# Patient Record
Sex: Male | Born: 1955 | Race: White | Hispanic: No | Marital: Married | Smoking: Never smoker
Health system: Southern US, Community
[De-identification: ages and names within clinical notes are randomized; demographics above are authoritative.]

## PROBLEM LIST (undated history)

## (undated) DIAGNOSIS — N200 Calculus of kidney: Secondary | ICD-10-CM

## (undated) DIAGNOSIS — E7800 Pure hypercholesterolemia, unspecified: Secondary | ICD-10-CM

## (undated) HISTORY — PX: KIDNEY STONE SURGERY: SHX686

---

## 2018-03-01 ENCOUNTER — Other Ambulatory Visit: Payer: Self-pay

## 2018-03-01 ENCOUNTER — Encounter: Payer: Self-pay | Admitting: Emergency Medicine

## 2018-03-01 ENCOUNTER — Emergency Department: Payer: Commercial Managed Care - PPO

## 2018-03-01 ENCOUNTER — Other Ambulatory Visit: Payer: Self-pay | Admitting: Radiology

## 2018-03-01 ENCOUNTER — Emergency Department
Admission: EM | Admit: 2018-03-01 | Discharge: 2018-03-01 | Disposition: A | Discharge status: Home / Self Care | Payer: Commercial Managed Care - PPO | Attending: Emergency Medicine | Admitting: Emergency Medicine

## 2018-03-01 DIAGNOSIS — N200 Calculus of kidney: Secondary | ICD-10-CM

## 2018-03-01 DIAGNOSIS — R109 Unspecified abdominal pain: Secondary | ICD-10-CM | POA: Diagnosis present

## 2018-03-01 HISTORY — DX: Calculus of kidney: N20.0

## 2018-03-01 HISTORY — DX: Pure hypercholesterolemia, unspecified: E78.00

## 2018-03-01 LAB — COMPREHENSIVE METABOLIC PANEL
ALBUMIN: 4.7 g/dL (ref 3.5–5.0)
ALT: 31 U/L (ref 17–63)
AST: 37 U/L (ref 15–41)
Alkaline Phosphatase: 55 U/L (ref 38–126)
Anion gap: 5 (ref 5–15)
BUN: 17 mg/dL (ref 6–20)
CHLORIDE: 107 mmol/L (ref 101–111)
CO2: 28 mmol/L (ref 22–32)
Calcium: 9.3 mg/dL (ref 8.9–10.3)
Creatinine, Ser: 1.17 mg/dL (ref 0.61–1.24)
GFR calc Af Amer: >60 mL/min (ref >60)
Glucose, Bld: 134 mg/dL — ABNORMAL HIGH (ref 65–99)
POTASSIUM: 4.2 mmol/L (ref 3.5–5.1)
Sodium: 140 mmol/L (ref 135–145)
Total Bilirubin: 0.7 mg/dL (ref 0.3–1.2)
Total Protein: 7 g/dL (ref 6.5–8.1)

## 2018-03-01 LAB — CBC
HEMATOCRIT: 46.7 % (ref 40.0–52.0)
HEMOGLOBIN: 15.6 g/dL (ref 13.0–18.0)
MCH: 32.6 pg (ref 26.0–34.0)
MCHC: 33.3 g/dL (ref 32.0–36.0)
MCV: 97.8 fL (ref 80.0–100.0)
Platelets: 243 10*3/uL (ref 150–440)
RBC: 4.77 MIL/uL (ref 4.40–5.90)
RDW: 12.4 % (ref 11.5–14.5)
WBC: 17.1 10*3/uL — ABNORMAL HIGH (ref 3.8–10.6)

## 2018-03-01 LAB — URINALYSIS, COMPLETE (UACMP) WITH MICROSCOPIC
Bacteria, UA: NONE SEEN
Bilirubin Urine: NEGATIVE
Glucose, UA: NEGATIVE mg/dL
Hgb urine dipstick: NEGATIVE
KETONES UR: NEGATIVE mg/dL
LEUKOCYTES UA: NEGATIVE
Nitrite: NEGATIVE
Protein, ur: NEGATIVE mg/dL
SQUAMOUS EPITHELIAL / LPF: NONE SEEN (ref 0–5)
Specific Gravity, Urine: 1.018 (ref 1.005–1.030)
pH: 8 (ref 5.0–8.0)

## 2018-03-01 LAB — LIPASE, BLOOD: LIPASE: 32 U/L (ref 11–51)

## 2018-03-01 MED ORDER — HYDROMORPHONE HCL 1 MG/ML IJ SOLN
0.5000 mg | Freq: Once | INTRAMUSCULAR | Status: AC
  Administered 2018-03-01: 0.5 mg via INTRAVENOUS
  Filled 2018-03-01: qty 1

## 2018-03-01 MED ORDER — ONDANSETRON 4 MG PO TBDP: 4.0000 mg | ORAL_TABLET | Freq: Three times a day (TID) | ORAL | 0 refills | Status: AC | PRN

## 2018-03-01 MED ORDER — OXYCODONE-ACETAMINOPHEN 5-325 MG PO TABS
1.0000 | ORAL_TABLET | ORAL | 0 refills | Status: AC | PRN
Start: 2018-03-01 — End: ?

## 2018-03-01 MED ORDER — HYDROMORPHONE HCL 1 MG/ML IJ SOLN
1.0000 mg | Freq: Once | INTRAMUSCULAR | Status: AC
  Administered 2018-03-01: 1 mg via INTRAVENOUS
  Filled 2018-03-01: qty 1

## 2018-03-01 MED ORDER — TAMSULOSIN HCL 0.4 MG PO CAPS: 0.4000 mg | ORAL_CAPSULE | Freq: Every day | ORAL | 0 refills | Status: AC

## 2018-03-01 MED ORDER — SODIUM CHLORIDE 0.9 % IV BOLUS
1000.0000 mL | Freq: Once | INTRAVENOUS | Status: AC
  Administered 2018-03-01: 1000 mL via INTRAVENOUS

## 2018-03-01 MED ORDER — ONDANSETRON HCL 4 MG/2ML IJ SOLN
4.0000 mg | Freq: Once | INTRAMUSCULAR | Status: AC
  Administered 2018-03-01: 4 mg via INTRAVENOUS
  Filled 2018-03-01: qty 2

## 2018-03-01 MED ORDER — KETOROLAC TROMETHAMINE 30 MG/ML IJ SOLN
30.0000 mg | Freq: Once | INTRAMUSCULAR | Status: AC
  Administered 2018-03-01: 30 mg via INTRAVENOUS
  Filled 2018-03-01: qty 1

## 2018-03-01 MED ORDER — IBUPROFEN 800 MG PO TABS: 800.0000 mg | ORAL_TABLET | Freq: Three times a day (TID) | ORAL | 0 refills | Status: AC | PRN

## 2018-03-01 NOTE — Discharge Instructions (Addendum)
As we discussed your work-up today is consistent with a fairly large right-sided kidney stone.  Please drink plenty of fluids.  Use ibuprofen every 8 hours as prescribed, use Percocet and Zofran as needed for pain and nausea as written.  Return to the emergency department for any worsening pain not able to control pain at home, vomiting unable to keep down fluids, fever 100.4 or higher, or any other symptom personally concerning to yourself.  Otherwise please call the number provided for Dr. Lonna Cobb to arrange a follow-up appointment as soon as possible.

## 2018-03-01 NOTE — ED Triage Notes (Signed)
Decreased UOP.  Right flank pain radiating to right testicle. +vomiting

## 2018-03-01 NOTE — ED Notes (Signed)
Pt aware of need for urine specimen. 

## 2018-03-01 NOTE — ED Notes (Signed)
Patient transported to CT 

## 2018-03-01 NOTE — ED Provider Notes (Signed)
[facility] ([place]) Emergency Department Provider Note  Time seen: 8:31 AM  I have reviewed the triage vital signs and the nursing notes.   HISTORY  Chief Complaint Flank Pain    HPI Jose Wilcox is a 62 y.o. male with a past medical history of kidney stones, hyperlipidemia, presents to the emergency department for right flank pain.  According to the patient around 11:00 last night he developed moderate to severe sharp right-sided pain in his right flank/back.  States a history of kidney stones but has not had one for 14 years but does state this feels very similar.  Denies dysuria or hematuria but has been urinating very small amounts.  Denies any fever.  Does state nausea with several episodes of vomiting around 2:00 this morning.  Denies diarrhea.  Largely negative review of systems.   Past Medical History:  Diagnosis Date  . Hypercholesteremia   . Kidney stone     There are no active problems to display for this patient.   Past Surgical History:  Procedure Laterality Date  . KIDNEY STONE SURGERY      Prior to Admission medications   Not on File    No Known Allergies  History reviewed. No pertinent family history.  Social History Social History   Tobacco Use  . Smoking status: Never Smoker  . Smokeless tobacco: Never Used  Substance Use Topics  . Alcohol use: Never    Frequency: Never  . Drug use: Never    Review of Systems Constitutional: Negative for fever. Eyes: Negative for visual complaints ENT: Negative for recent illness/congestion Cardiovascular: Negative for chest pain. Respiratory: Negative for shortness of breath. Gastrointestinal: Right flank pain/right back pain.  Positive for nausea vomiting.  Negative for diarrhea Genitourinary: Decreased urination.  Negative for hematuria or dysuria. Musculoskeletal: Negative for musculoskeletal complaints Skin: Negative for skin complaints  Neurological: Negative for headache All  other ROS negative  ____________________________________________   PHYSICAL EXAM:  VITAL SIGNS: ED Triage Vitals [03/01/18 0825]  Enc Vitals Group     BP (!) 170/95     Pulse Rate 62     Resp (!) 22     Temp 98.4 F (36.9 C)     Temp Source Oral     SpO2 97 %     Weight 165 lb (74.8 kg)     Height  (1.676 m)     Head Circumference      Peak Flow      Pain Score 9     Pain Loc      Pain Edu?      Excl. in GC?    Constitutional: Alert and oriented.  Mild distress due to pain Eyes: Normal exam ENT   Head: Normocephalic and atraumatic.   Mouth/Throat: Mucous membranes are moist. Cardiovascular: Normal rate, regular rhythm. No murmur Respiratory: Normal respiratory effort without tachypnea nor retractions. Breath sounds are clear  Gastrointestinal: Soft, nontender.  No distention. Musculoskeletal: Nontender with normal range of motion in all extremities.  Neurologic:  Normal speech and language. No gross focal neurologic deficits Skin:  Skin is warm, dry and intact.  Psychiatric: Mood and affect are normal.   ____________________________________________    RADIOLOGY  CT scan shows 2 distal right ureteral stones with obstructive changes.  ____________________________________________   INITIAL IMPRESSION / ASSESSMENT AND PLAN / ED COURSE  Pertinent labs & imaging results that were available during my care of the patient were reviewed by me and considered in my medical  decision making (see chart for details).  Patient presents to the emergency department for right flank pain starting around 11:00 last night states it has been constant, sharp moderate to severe in severity.  Differential includes ureterolithiasis, appendicitis, colitis or diverticulitis, urinary tract infection or pyelonephritis.  Will obtain CT imaging to help evaluate for possible ureterolithiasis, lab work, urinalysis and continue to closely monitor.  Patient agreeable this plan of care.  We  will treat pain and nausea, IV hydrate while awaiting results.  CT shows 2 distal right ureteral stones with obstructive changes.  Given these findings I discussed the patient with Dr. Lonna Cobb of urology.  He states no caliceal rupture that he appreciates.  Believe the patient is safe for discharge home as long as his pain is controlled and he can follow-up in the office.  Patient states his pain is minimal at this time after receiving Toradol.  I discussed return precautions for any fever worsening abdominal pain or vomiting unable to keep down fluids or pain medication.  Patient agreeable to this plan of care will follow up with Dr. Lonna Cobb.  We will discharge with Percocet, ibuprofen Flomax and Toradol.  We also discharged with a urine strainer. ____________________________________________   FINAL CLINICAL IMPRESSION(S) / ED DIAGNOSES  Right flank pain Kidney stone   Minna Antis, MD 03/01/18 1103

## 2018-03-02 LAB — URINE CULTURE: Culture: NO GROWTH

## 2018-03-03 ENCOUNTER — Ambulatory Visit: Payer: Self-pay | Admitting: Urology

## 2019-10-31 IMAGING — CT CT RENAL STONE PROTOCOL
2 of 4 series · 16 of 46 positions shown, 18 images · non-contrast
Comparison: None.

CLINICAL DATA: Decreased urine output with right flank pain,
initial encounter

EXAM:
CT ABDOMEN AND PELVIS WITHOUT CONTRAST
TECHNIQUE: Multidetector CT imaging of the abdomen and pelvis was performed
following the standard protocol without IV contrast.

[Series 2: stone full standard · axial · 0.70mm/px · z∈[-790,-370]mm · 13 of 92 slices shown, 15 images]
[im 4/92  soft-tissue]
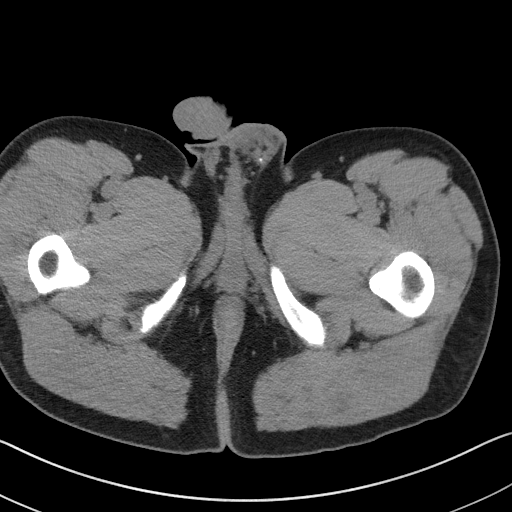
[im 4/92  bone]
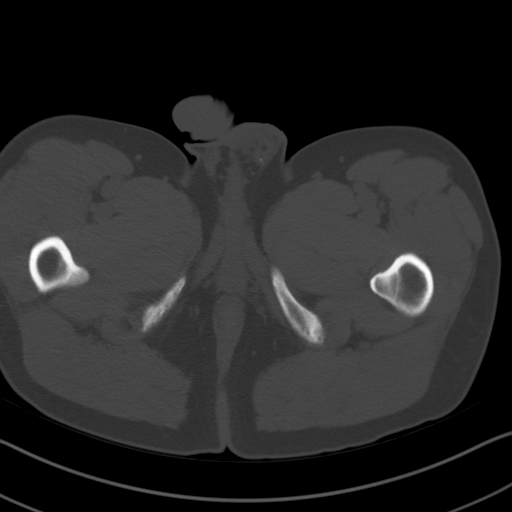
[im 12/92  soft-tissue]
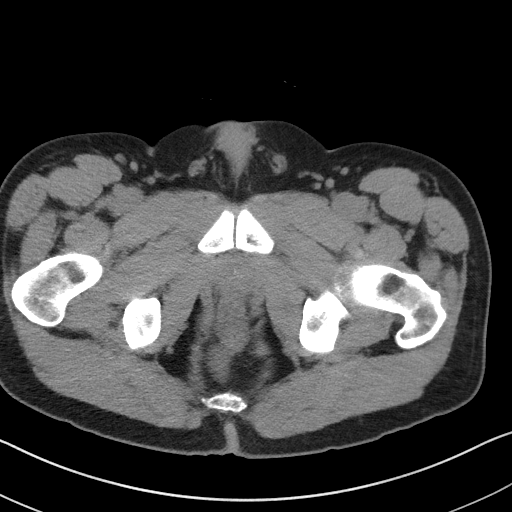
[im 20/92  soft-tissue]
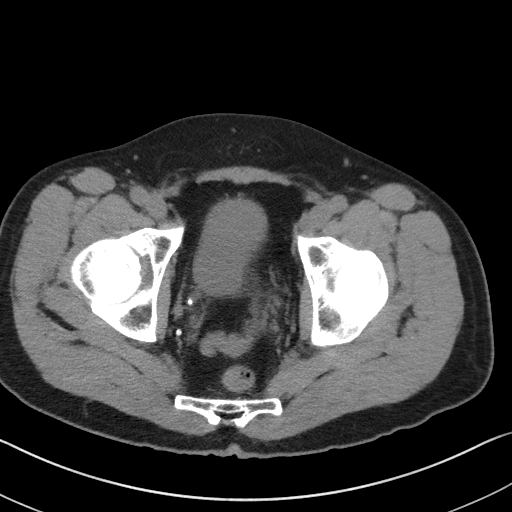
[im 24/92  soft-tissue]
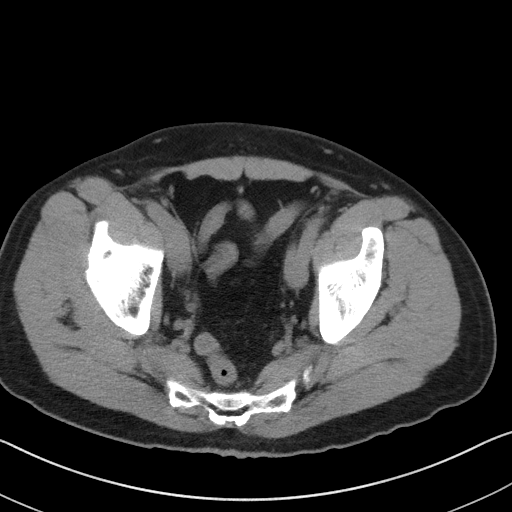
[im 32/92  soft-tissue]
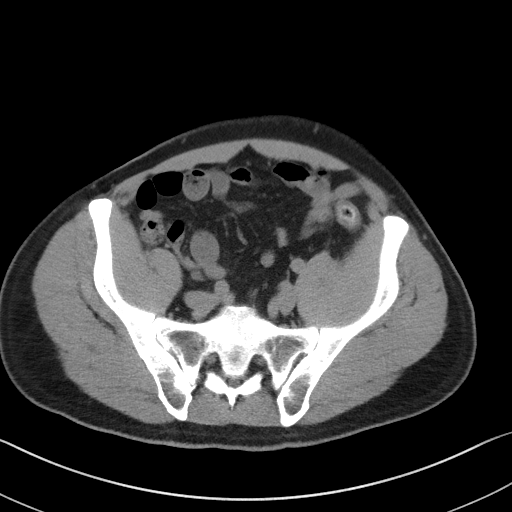
[im 40/92  soft-tissue]
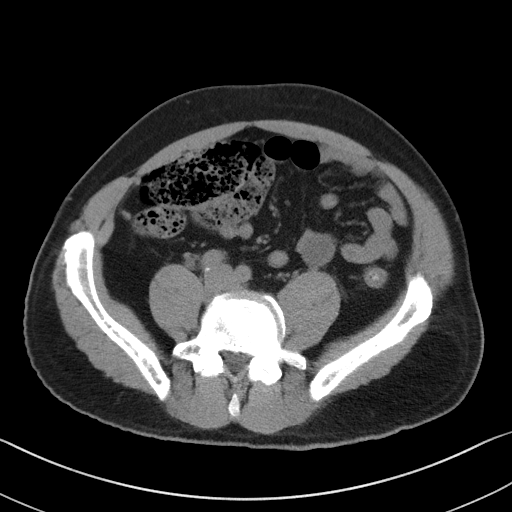
[im 48/92  soft-tissue]
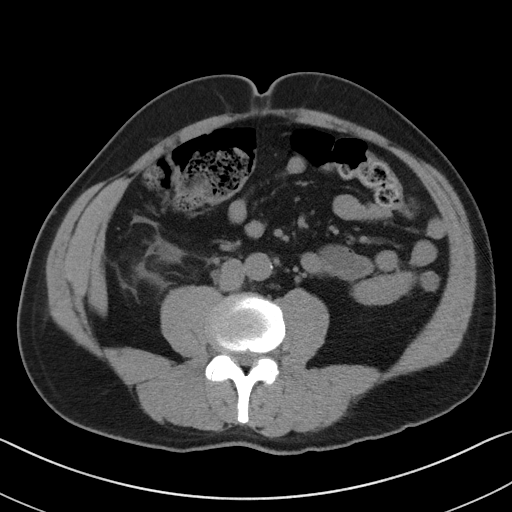
[im 52/92  soft-tissue]
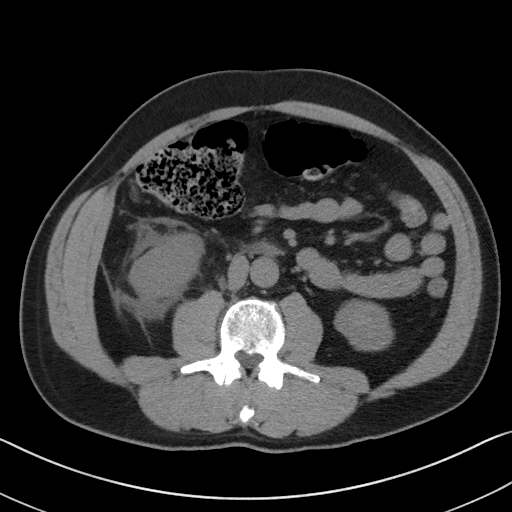
[im 60/92  soft-tissue]
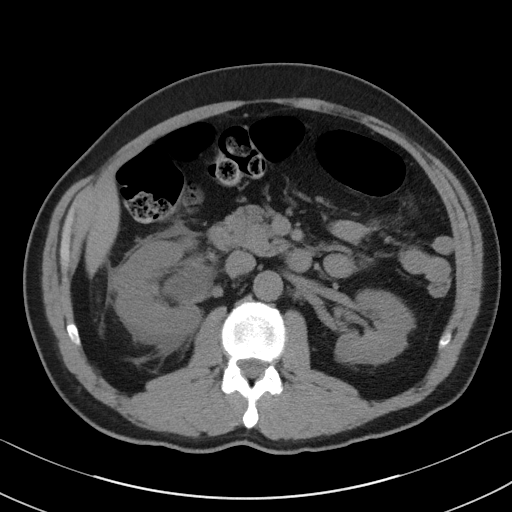
[im 60/92  bone]
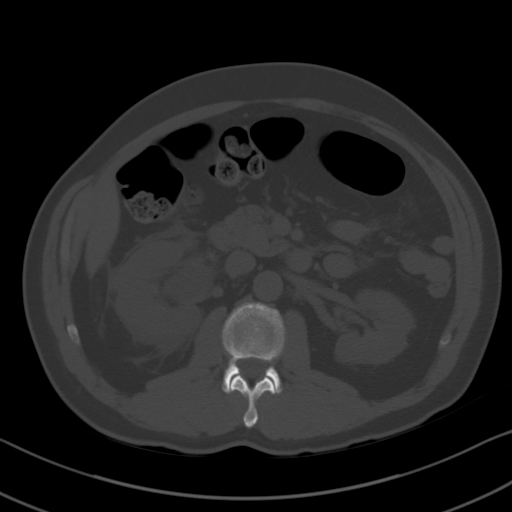
[im 68/92  soft-tissue]
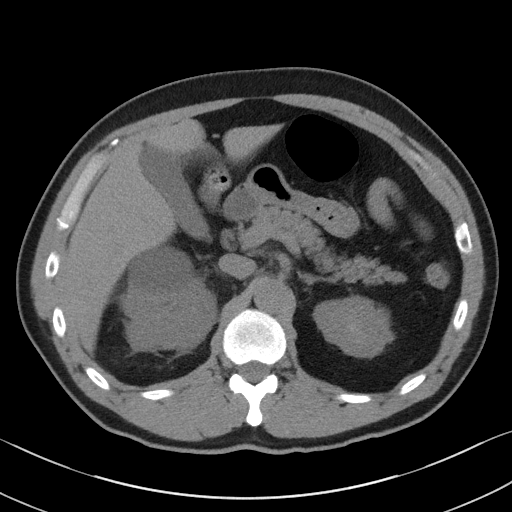
[im 72/92  soft-tissue]
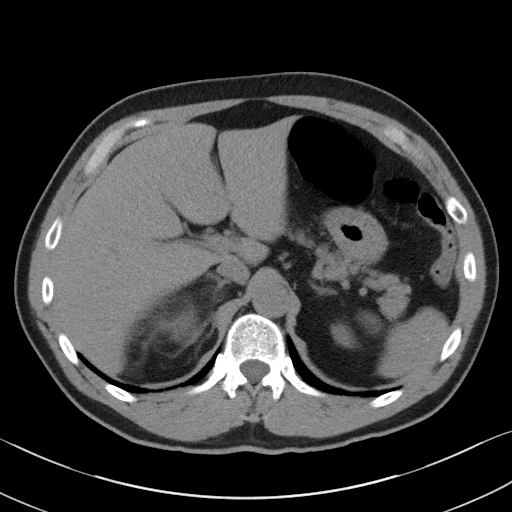
[im 80/92  soft-tissue]
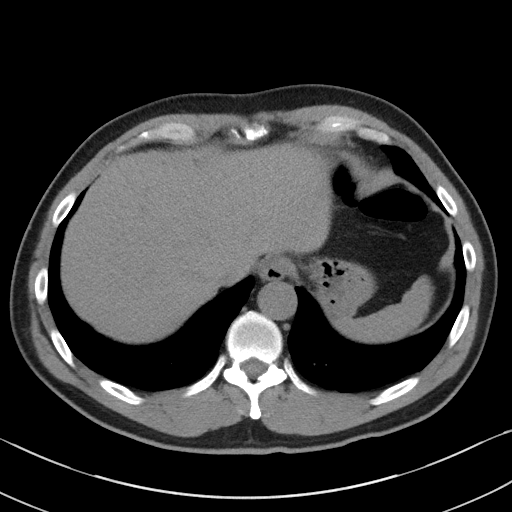
[im 88/92  soft-tissue]
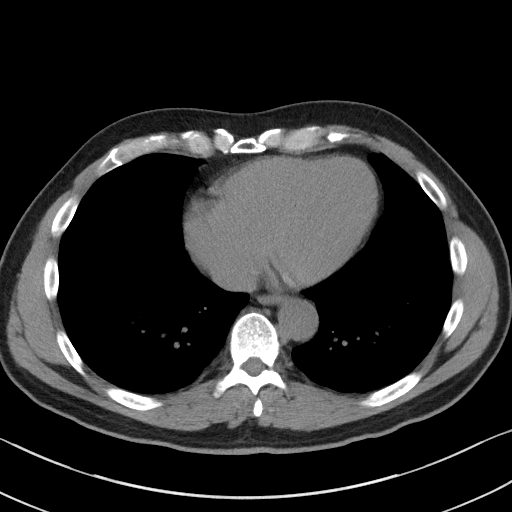

[Series 5: coronal · coronal · 0.74mm/px · 3 of 132 slices shown]
[im 44/132  soft-tissue]
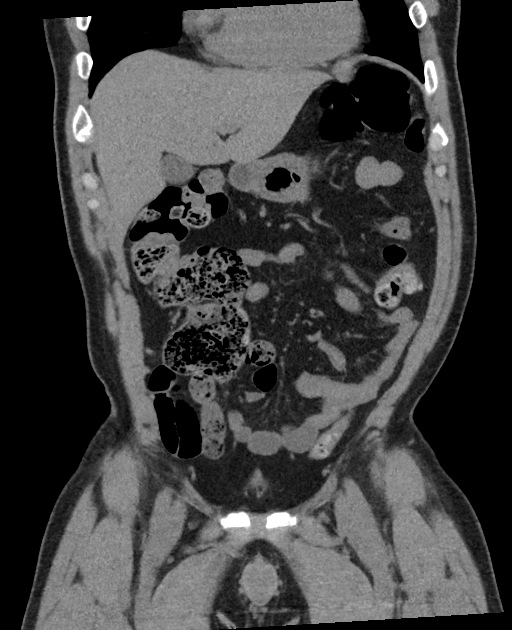
[im 59/132  soft-tissue]
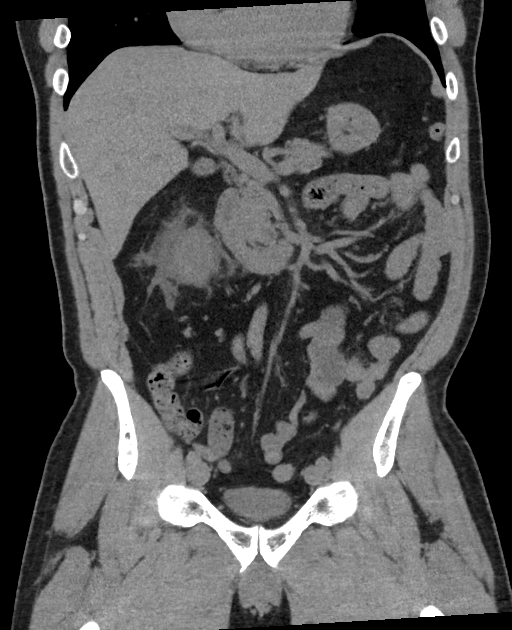
[im 73/132  soft-tissue]
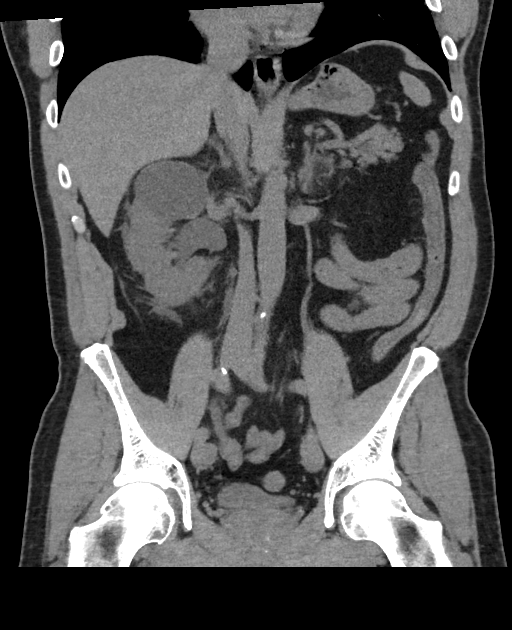

[16 of 46 positions shown; findings below may reference images not displayed]

FINDINGS: Lower chest: No acute abnormality.

Hepatobiliary: No focal liver abnormality is seen. No gallstones,
gallbladder wall thickening, or biliary dilatation.

Pancreas: Unremarkable. No pancreatic ductal dilatation or
surrounding inflammatory changes.

Spleen: Multiple calcified granulomas are seen.

Adrenals/Urinary Tract: Adrenal glands are within normal limits
bilaterally. Nonobstructing renal calculi are noted on the left the
largest of which lies in the lower pole measuring 8 mm. No ureteral
stones on the left are noted. The bladder is decompressed. On the
right there is evidence of hydronephrosis and hydroureter which
extends inferiorly to an area where to ureteral stones are noted
distally. These are best seen on images 84 of series 5 and 73 of
series 2. The largest of these measures approximately 4 mm. Some
nonobstructing stones in the right kidney are noted as well.
Perinephric stranding is noted in the right kidney consistent with
the obstructive change and possibly calyceal rupture. A large renal
cyst is noted anteriorly within the midportion of the right kidney
which measures 5.4 cm.

Stomach/Bowel: Stomach is within normal limits. Appendix appears
normal. No evidence of bowel wall thickening, distention, or
inflammatory changes.

Vascular/Lymphatic: Aortic atherosclerosis. No enlarged abdominal or
pelvic lymph nodes.

Reproductive: Prostate is unremarkable.

Other: No abdominal wall hernia or abnormality. No abdominopelvic
ascites.

Musculoskeletal: No acute or significant osseous findings.
IMPRESSION: Two distal right ureteral stones with obstructive change and
perinephric stranding possibly related to caliceal rupture.

Nonobstructing renal stones bilaterally left greater than right.

Right renal cyst.

Changes of prior granulomatous disease.
